# Patient Record
Sex: Female | Born: 1950 | Hispanic: No | Smoking: Never smoker
Health system: Southern US, Community
[De-identification: ages and names within clinical notes are randomized; demographics above are authoritative.]

---

## 2019-07-12 ENCOUNTER — Other Ambulatory Visit: Payer: Self-pay | Admitting: Nephrology

## 2019-07-12 DIAGNOSIS — N1832 Chronic kidney disease, stage 3b: Secondary | ICD-10-CM

## 2019-07-12 DIAGNOSIS — I129 Hypertensive chronic kidney disease with stage 1 through stage 4 chronic kidney disease, or unspecified chronic kidney disease: Secondary | ICD-10-CM

## 2019-07-22 ENCOUNTER — Ambulatory Visit
Admission: RE | Admit: 2019-07-22 | Discharge: 2019-07-22 | Disposition: A | Discharge status: Home / Self Care | Payer: Medicare Other | Source: Ambulatory Visit | Attending: Nephrology | Admitting: Nephrology

## 2019-07-22 ENCOUNTER — Other Ambulatory Visit: Payer: Self-pay

## 2019-07-22 DIAGNOSIS — I129 Hypertensive chronic kidney disease with stage 1 through stage 4 chronic kidney disease, or unspecified chronic kidney disease: Secondary | ICD-10-CM | POA: Insufficient documentation

## 2019-07-22 DIAGNOSIS — N1832 Chronic kidney disease, stage 3b: Secondary | ICD-10-CM | POA: Insufficient documentation

## 2021-12-09 IMAGING — US US RENAL
1 series · 14 of 25 positions shown · non-contrast
Comparison: None.

CLINICAL DATA: Stage III chronic renal disease

EXAM:
RENAL / URINARY TRACT ULTRASOUND COMPLETE

[Series 1: us renal · 0.18mm/px · 14 of 66 slices shown]
[im 1/66]
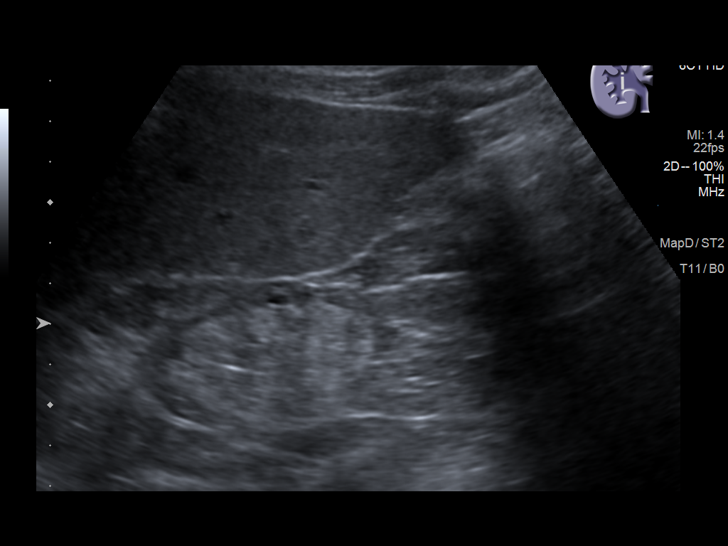
[im 6/66]
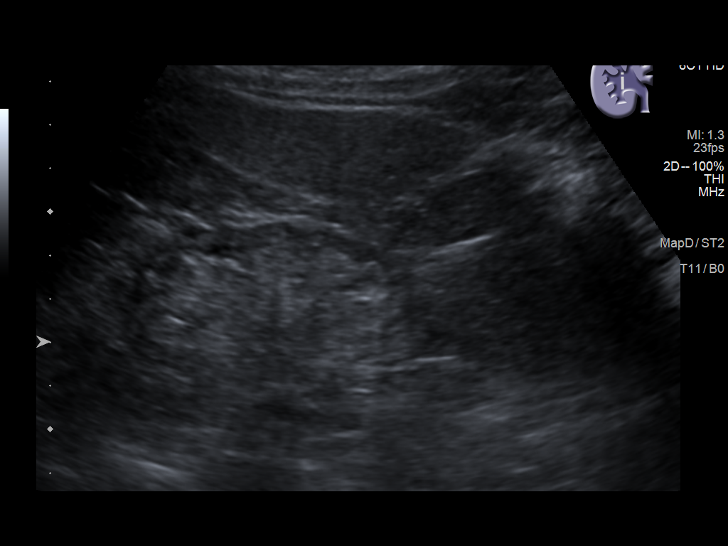
[im 11/66]
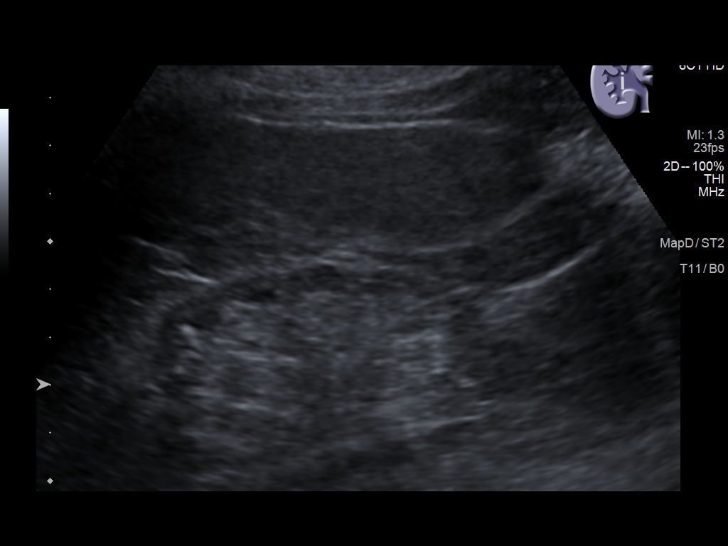
[im 17/66]
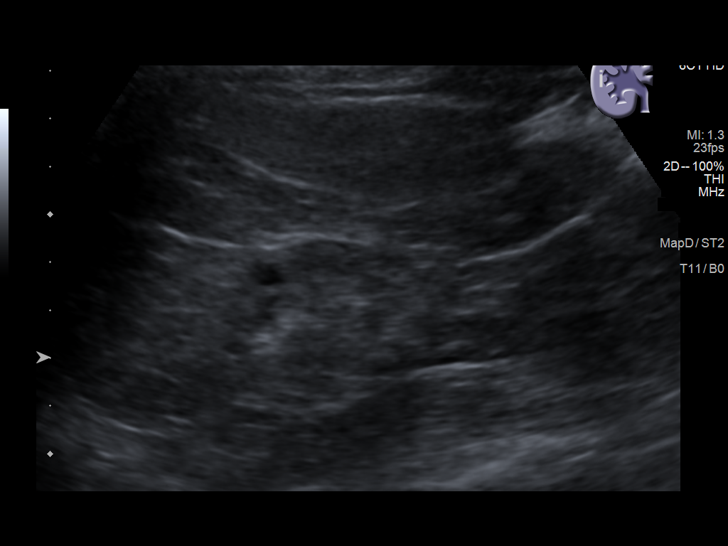
[im 22/66]
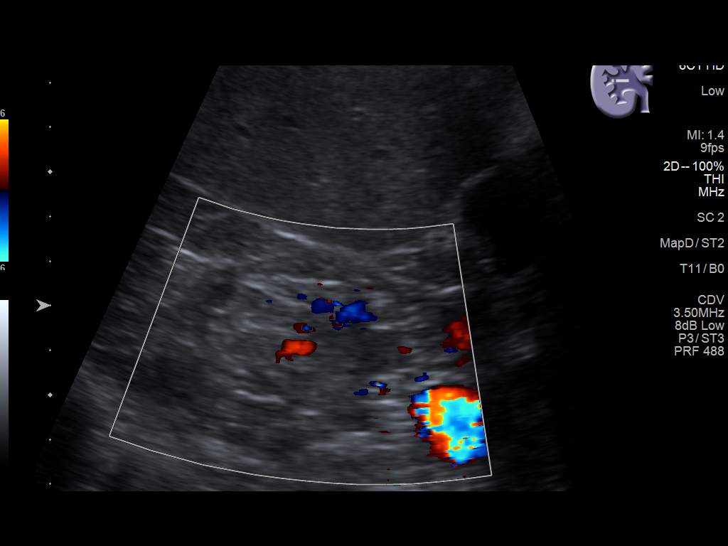
[im 25/66]
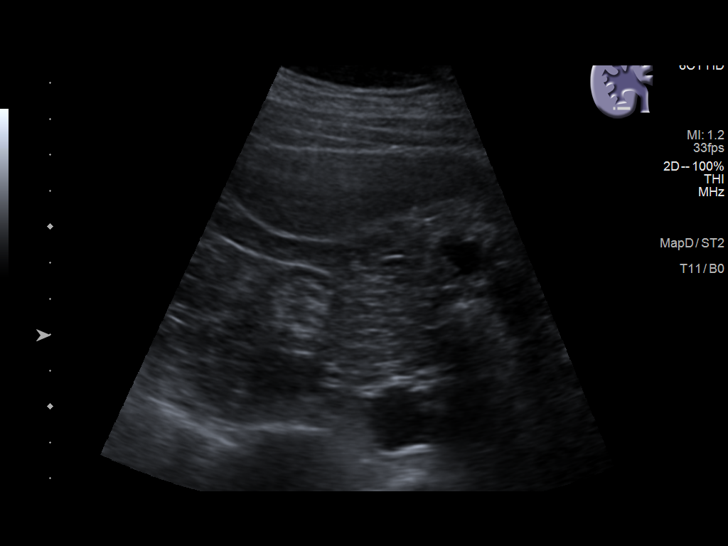
[im 30/66]
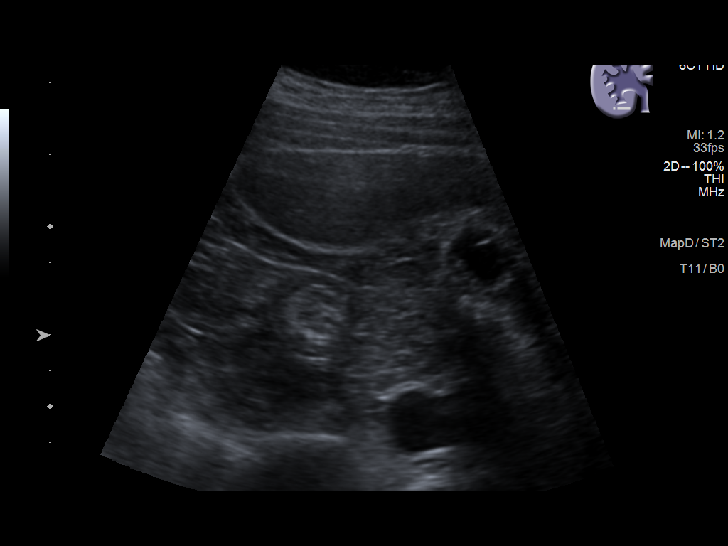
[im 36/66]
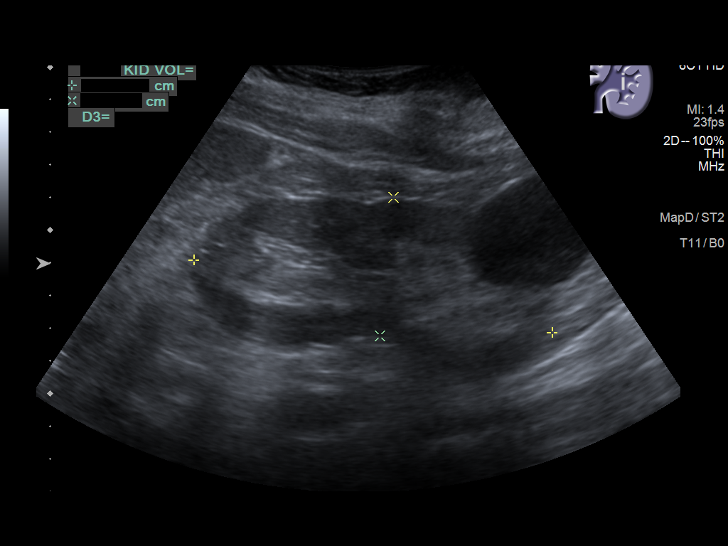
[im 41/66]
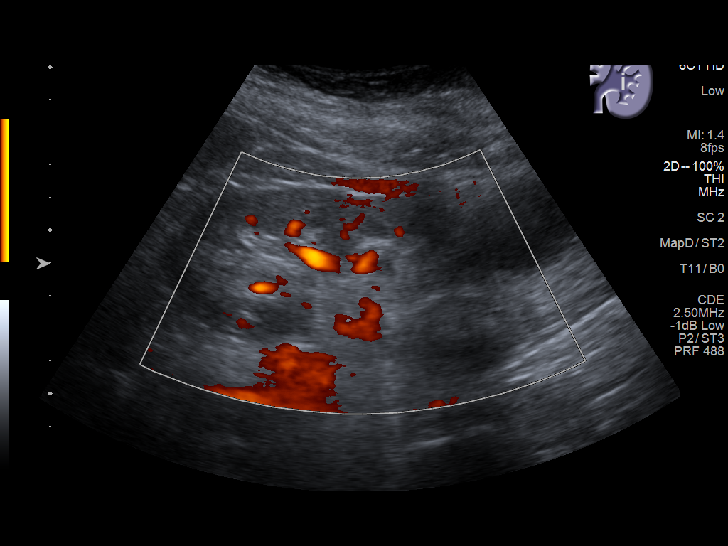
[im 44/66]
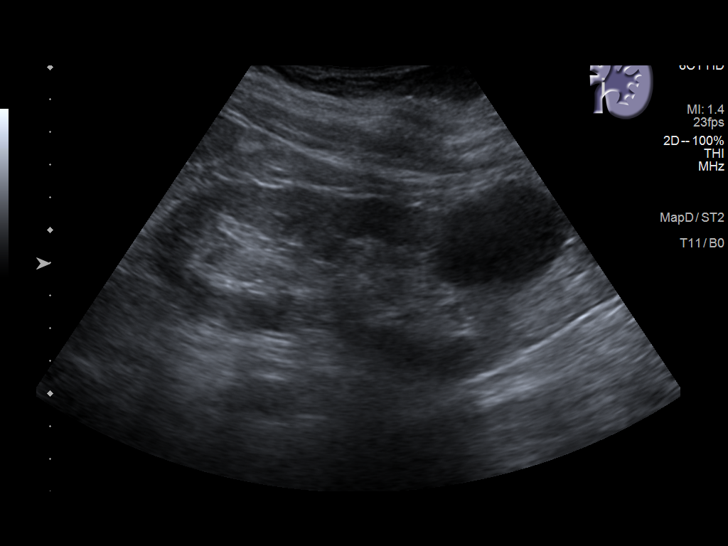
[im 49/66]
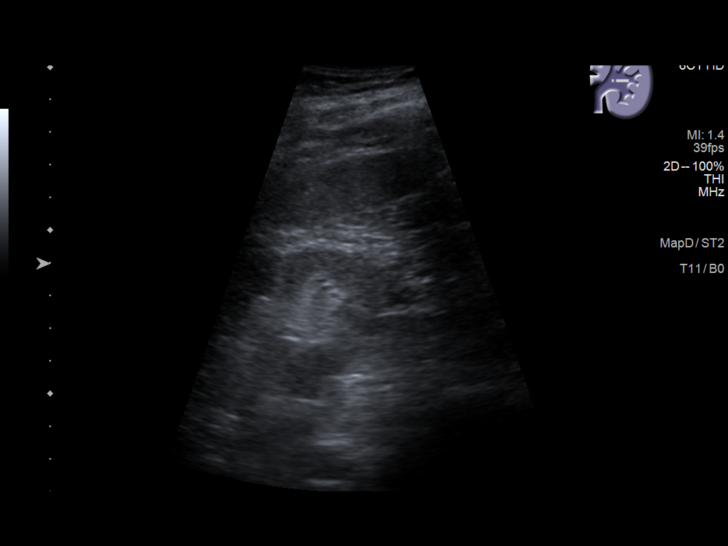
[im 55/66]
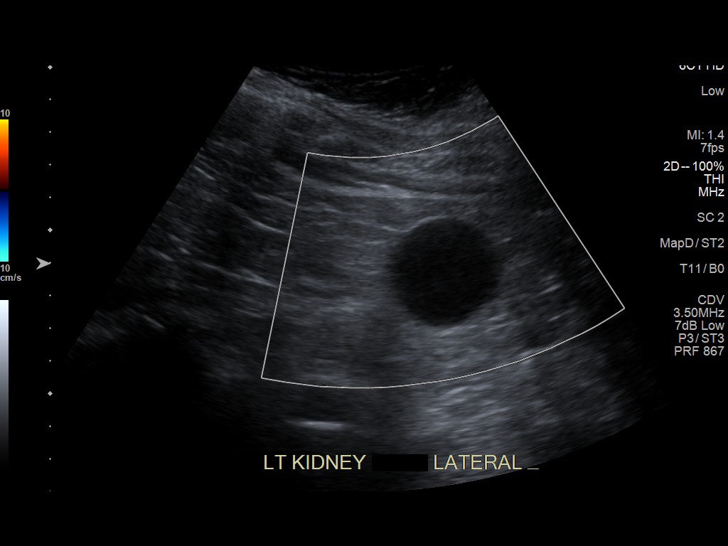
[im 60/66]
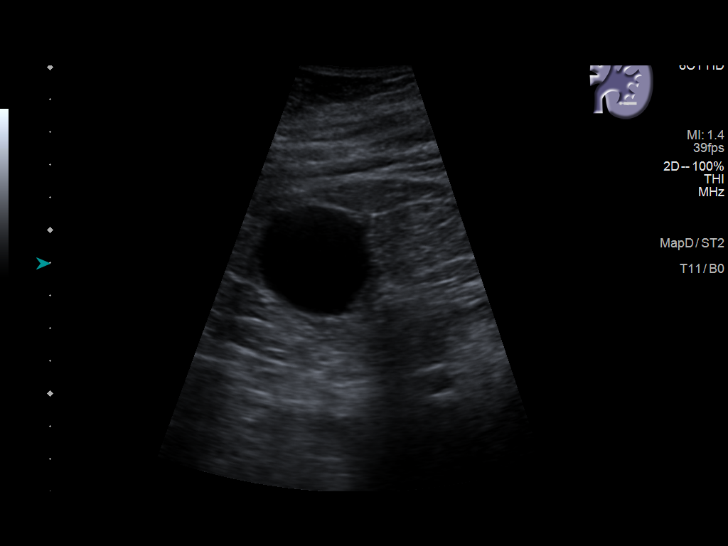
[im 66/66]
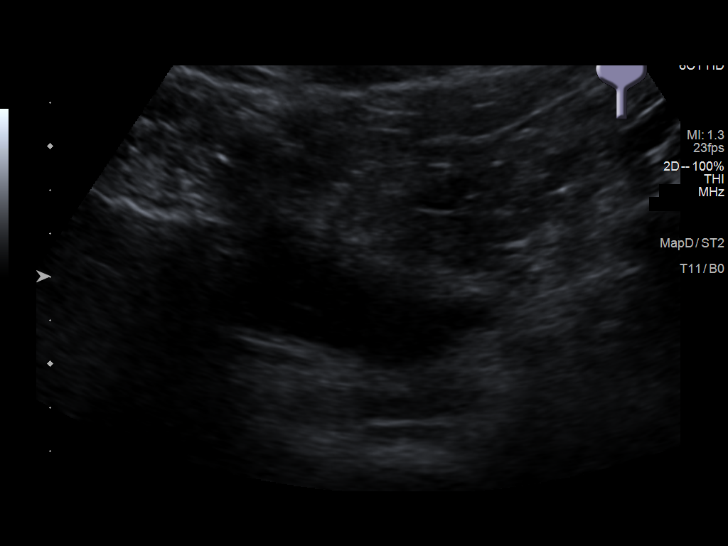

[14 of 25 positions shown; findings below may reference images not displayed]

FINDINGS: Right Kidney:

Renal measurements: 7.2 x 3.4 x 2.8 cm = volume: 35 mL. Cortical
thinning. Small hypoechoic benign-appearing cysts.

Left Kidney:

Renal measurements: 11.2 x 4.3 x 3.6 cm = volume: 90 mL. A 4 cm
anechoic cyst in lower pole. Normal cortical thickness.

Bladder:

Appears normal for degree of bladder distention.

Other:

None.
IMPRESSION: 1. Renal cortical thinning on the RIGHT. RIGHT kidney smaller than
the LEFT.
2. Bilateral benign-appearing renal cysts.
3. No hydronephrosis.

## 2022-05-10 ENCOUNTER — Other Ambulatory Visit: Payer: Self-pay

## 2022-05-10 ENCOUNTER — Emergency Department
Admission: EM | Admit: 2022-05-10 | Discharge: 2022-05-11 | Disposition: A | Discharge status: Home / Self Care | Payer: Medicare Other | Attending: Emergency Medicine | Admitting: Emergency Medicine

## 2022-05-10 ENCOUNTER — Emergency Department: Payer: Medicare Other

## 2022-05-10 DIAGNOSIS — N189 Chronic kidney disease, unspecified: Secondary | ICD-10-CM | POA: Diagnosis not present

## 2022-05-10 DIAGNOSIS — S01511A Laceration without foreign body of lip, initial encounter: Secondary | ICD-10-CM

## 2022-05-10 DIAGNOSIS — M25561 Pain in right knee: Secondary | ICD-10-CM | POA: Insufficient documentation

## 2022-05-10 DIAGNOSIS — I129 Hypertensive chronic kidney disease with stage 1 through stage 4 chronic kidney disease, or unspecified chronic kidney disease: Secondary | ICD-10-CM | POA: Insufficient documentation

## 2022-05-10 DIAGNOSIS — W01190A Fall on same level from slipping, tripping and stumbling with subsequent striking against furniture, initial encounter: Secondary | ICD-10-CM | POA: Insufficient documentation

## 2022-05-10 DIAGNOSIS — S0993XA Unspecified injury of face, initial encounter: Secondary | ICD-10-CM | POA: Diagnosis present

## 2022-05-10 DIAGNOSIS — W19XXXA Unspecified fall, initial encounter: Secondary | ICD-10-CM

## 2022-05-10 MED ORDER — ACETAMINOPHEN 500 MG PO TABS
1000.0000 mg | ORAL_TABLET | Freq: Once | ORAL | Status: AC
  Administered 2022-05-10: 1000 mg via ORAL
  Filled 2022-05-10: qty 2

## 2022-05-10 MED ORDER — LIDOCAINE HCL (PF) 1 % IJ SOLN
10.0000 mL | Freq: Once | INTRAMUSCULAR | Status: AC
  Administered 2022-05-11: 10 mL
  Filled 2022-05-10: qty 10

## 2022-05-10 NOTE — ED Triage Notes (Signed)
Pt reports tripping over baby gate and hitting lip on a dresser. Denies LOC or daily thinners. Presents with washcloth held to lip. Denies daily thinners. Pt alert and oriented on arrival and ambulates independently. Breathing unlabored with symmetric chest rise and fall.

## 2022-05-10 NOTE — ED Provider Notes (Signed)
[facility] Provider Note    Event Date/Time   First MD Initiated Contact with Patient 05/10/22 2256     (approximate)   History   Lip Laceration   HPI  Ariel Wolfe is a 72 y.o. female who presents to the ED for evaluation of Lip Laceration   I review 1/22 nephrology visit. Hx HTN, CKD. No anticoagulation.   Patient presents to the ED, accompanied by her sister, for evaluation of right knee pain and lip laceration after mechanical fall.  She reports using a baby gate to help keep her dogs isolated to a particular area within her house.  Instead of just opening the gate, she tried to climb over it and reports getting tripped up and falling and striking her face on a nearby dresser.  No syncope.  She has been able to get up and walk around since that time, but reports right knee pain since that time.  No recent or preceding illnesses.   Physical Exam   Triage Vital Signs: ED Triage Vitals  Enc Vitals Group     BP 05/10/22 2228 (!) 168/84     Pulse Rate 05/10/22 2228 77     Resp 05/10/22 2228 18     Temp 05/10/22 2228 98 F (36.7 C)     Temp Source 05/10/22 2228 Oral     SpO2 05/10/22 2228 98 %     Weight 05/10/22 2226 165 lb (74.8 kg)     Height 05/10/22 2226 5' 4"$  (1.626 m)     Head Circumference --      Peak Flow --      Pain Score 05/10/22 2226 4     Pain Loc --      Pain Edu? --      Excl. in [place]? --     Most recent vital signs: Vitals:   05/10/22 2228  BP: (!) 168/84  Pulse: 77  Resp: 18  Temp: 98 F (36.7 C)  SpO2: 98%    General: Awake, no distress.  CV:  Good peripheral perfusion.  Resp:  Normal effort.  Abd:  No distention.  MSK:  Closed deformity to the right knee with fairly diffuse soft tissue swelling and bruising anteriorly..  Full active and passive range of motion of that knee, hip and ankle. Palpation of all 4 extremities otherwise out evidence of trauma, deformity or injury. No signs of trauma to the back or  tenderness to the back or neck. Neuro:  No focal deficits appreciated. Cranial nerves II through XII intact 5/5 strength and sensation in all 4 extremities Other:  Full-thickness horizontally oriented laceration of the lower lip, as pictured below.  Externally is approximately 6 cm in length, internally/mucosally is a stellate laceration that is up to about 3 cm.  Hemostatic with direct pressure.  No other intraoral injury or loose teeth or signs of upper airway obstruction.      ED Results / Procedures / Treatments   Labs (all labs ordered are listed, but only abnormal results are displayed) Labs Reviewed - No data to display  EKG   RADIOLOGY Plain film of the right knee interpreted by me without evidence of fracture or dislocation  Official radiology report(s): DG Knee Complete 4 Views Right  Result Date: 05/10/2022 CLINICAL DATA:  [place], right knee pain and swelling EXAM: RIGHT KNEE - COMPLETE 4+ VIEW COMPARISON:  None Available. FINDINGS: Frontal, bilateral oblique, and cross-table lateral views of the right knee are obtained. No acute  displaced fracture, subluxation, or dislocation. There is marked prepatellar soft tissue swelling. Trace suprapatellar joint effusion. IMPRESSION: 1. Marked prepatellar soft tissue swelling. 2. Trace joint effusion. 3. No acute fracture. Electronically Signed   By: Randa Ngo M.D.   On: 05/10/2022 23:31    PROCEDURES and INTERVENTIONS:  .Marland KitchenLaceration Repair  Date/Time: 05/11/2022 1:25 AM  Performed by: Vladimir Crofts, MD Authorized by: Vladimir Crofts, MD   Consent:    Consent obtained:  Verbal   Consent given by:  Patient   Risks, benefits, and alternatives were discussed: yes   Anesthesia:    Anesthesia method:  Local infiltration   Local anesthetic:  Lidocaine 1% w/o epi Laceration details:    Location:  Lip   Lip location:  Lower lip, full thickness   Vermilion border involved: no     Length (cm):  6 Exploration:    Hemostasis  achieved with:  Direct pressure   Wound exploration: wound explored through full range of motion and entire depth of wound visualized   Treatment:    Area cleansed with:  Povidone-iodine   Amount of cleaning:  Extensive   Irrigation solution:  Sterile saline   Irrigation method:  Syringe   Visualized foreign bodies/material removed: no     Layers/structures repaired:  Deep subcutaneous Deep subcutaneous:    Suture size:  4-0   Suture material:  Monocryl   Suture technique:  Horizontal mattress   Number of sutures:  2 Skin repair:    Repair method:  Sutures   Suture size:  5-0   Wound skin closure material used: Ethilon.   Suture technique:  Running locked   Number of sutures:  8 Approximation:    Approximation:  Close   Vermilion border well-aligned: yes   Repair type:    Repair type:  Complex Post-procedure details:    Procedure completion:  Tolerated well, no immediate complications Comments:     Additional mucosal repair with 2 sutures: 1 stellate suture as well as 1 simple interrupted utilizing 4-0 Monocryl with close approximation.    Medications  lidocaine (PF) (XYLOCAINE) 1 % injection 10 mL (has no administration in time range)  amoxicillin-clavulanate (AUGMENTIN) 875-125 MG per tablet 1 tablet (has no administration in time range)  acetaminophen (TYLENOL) tablet 1,000 mg (1,000 mg Oral Given 05/10/22 2346)     IMPRESSION / MDM / ASSESSMENT AND PLAN / ED COURSE  I reviewed the triage vital signs and the nursing notes.  Differential diagnosis includes, but is not limited to, dental injury, facial fracture, full-thickness laceration, knee or patellar dislocation, tibia or femur fracture, syncope, seizure  {Patient presents with symptoms of an acute illness or injury that is potentially life-threatening.  Pleasant 72 year old woman who lives independently presents after a mechanical fall with full-thickness lower lip laceration requiring bedside repair.  She does  have swelling of the right knee after striking it during the fall, but x-ray is reassuring and she is ambulatory with full range of motion on this extremity.  Laceration repaired, as above, in a layered approach.  She is started on Augmentin for infectious prophylaxis.  We thoroughly discussed wound care and expectant management.  We discussed following up closely with PCP, we discussed suture removal and we discussed return precautions for the ED.  I answered questions.  Sister at the bedside throughout encounter.  Clinical Course as of 05/11/22 [id number]  Nancy Fetter May 11, 2022  0103 X8 running locked 5-0 Ethilon  X2 horizontal mattress 4-0 [employer] x1 simple  interrupted, x1 stellate, monocryl [DS]    Clinical Course User Index [DS] Vladimir Crofts, MD     FINAL CLINICAL IMPRESSION(S) / ED DIAGNOSES   Final diagnoses:  Lip laceration, initial encounter  Fall, initial encounter     Rx / DC Orders   ED Discharge Orders          Ordered    amoxicillin-clavulanate (AUGMENTIN) 875-125 MG tablet  2 times daily        05/11/22 0102             Note:  This document was prepared using Dragon voice recognition software and may include unintentional dictation errors.   Vladimir Crofts, MD 05/11/22 [phone]

## 2022-05-10 NOTE — ED Notes (Signed)
Pt removes pants but declines to get into full gown, states that MD asked her only to take off pants.

## 2022-05-10 NOTE — ED Notes (Signed)
RN to bedside to introduce self to pt. Pt is CAOx4 and in no acute distress. Pt advised her right knee is painful and swollen as well from her fall and she forgot to let the MD know. I will make him aware at this time.

## 2022-05-11 DIAGNOSIS — S01511A Laceration without foreign body of lip, initial encounter: Secondary | ICD-10-CM | POA: Diagnosis not present

## 2022-05-11 MED ORDER — AMOXICILLIN-POT CLAVULANATE 875-125 MG PO TABS: 1.0000 | ORAL_TABLET | Freq: Two times a day (BID) | ORAL | 0 refills | Status: AC

## 2022-05-11 MED ORDER — AMOXICILLIN-POT CLAVULANATE 875-125 MG PO TABS
1.0000 | ORAL_TABLET | Freq: Once | ORAL | Status: AC
  Administered 2022-05-11: 1 via ORAL
  Filled 2022-05-11: qty 1

## 2022-05-11 NOTE — Discharge Instructions (Addendum)
You received a total of 12 stitches: 8 stitches on the outside that will not absorb on their own.  These will need to be removed after 5-7 days. 2 deep stitches that will absorb on their own 2 stitches on the anterior mouth that will absorb on their own  You are being discharged with a prescription for Augmentin antibiotics to take twice daily for the next 7 days to prevent infection.  Finish the whole course.  Gently wash the wound with soap and water.  It is okay to shower, but do not submerge in a bath or go swimming as it is healing.  Do not vigorously scrub.   Gently pat dry.   Once dry, then apply Neosporin or bacitracin or even Vaseline ointment to the area to act as a barrier to help prevent infection.  Use Tylenol for pain and fevers.  Up to 1000 mg per dose, up to 4 times per day.  Do not take more than 4000 mg of Tylenol/acetaminophen within 24 hours..  If you have poorly controlled pain, fevers or pus coming from the wound or other worsening symptoms then please return to the ED immediately.

## 2022-05-19 ENCOUNTER — Telehealth: Payer: Self-pay

## 2022-05-19 NOTE — Telephone Encounter (Signed)
     Patient  visit on 05/11/2022  at [facility] was for fall, lip laceration.  Have you been able to follow up with your primary care physician? Patient has appointment 05/20/22.  The patient was or was not able to obtain any needed medicine or equipment. Patient was able to obtain medication.  Are there diet recommendations that you are having difficulty following? No  Patient expresses understanding of discharge instructions and education provided has no other needs at this time. Yes   [place] Resource Care Guide   [email or web address]  ?? [id number]   [email or web address]: [email or web address]  [email or web address]
